# Patient Record
Sex: Female | Born: 1995 | Race: White | Hispanic: No | Marital: Single
Health system: Southern US, Community
[De-identification: ages and names within clinical notes are randomized; demographics above are authoritative.]

---

## 2020-01-14 ENCOUNTER — Other Ambulatory Visit: Payer: Self-pay

## 2020-01-14 ENCOUNTER — Emergency Department (HOSPITAL_COMMUNITY)
Admission: EM | Admit: 2020-01-14 | Discharge: 2020-01-14 | Disposition: A | Discharge status: Home / Self Care | Payer: BC Managed Care – PPO | Attending: Emergency Medicine | Admitting: Emergency Medicine

## 2020-01-14 ENCOUNTER — Encounter (HOSPITAL_COMMUNITY): Payer: Self-pay | Admitting: Emergency Medicine

## 2020-01-14 ENCOUNTER — Emergency Department (HOSPITAL_COMMUNITY): Payer: BC Managed Care – PPO

## 2020-01-14 DIAGNOSIS — S93402A Sprain of unspecified ligament of left ankle, initial encounter: Secondary | ICD-10-CM | POA: Insufficient documentation

## 2020-01-14 DIAGNOSIS — X500XXA Overexertion from strenuous movement or load, initial encounter: Secondary | ICD-10-CM | POA: Insufficient documentation

## 2020-01-14 DIAGNOSIS — Y929 Unspecified place or not applicable: Secondary | ICD-10-CM | POA: Diagnosis not present

## 2020-01-14 DIAGNOSIS — Y939 Activity, unspecified: Secondary | ICD-10-CM | POA: Diagnosis not present

## 2020-01-14 DIAGNOSIS — S99912A Unspecified injury of left ankle, initial encounter: Secondary | ICD-10-CM | POA: Diagnosis present

## 2020-01-14 DIAGNOSIS — Y999 Unspecified external cause status: Secondary | ICD-10-CM | POA: Insufficient documentation

## 2020-01-14 DIAGNOSIS — W19XXXA Unspecified fall, initial encounter: Secondary | ICD-10-CM

## 2020-01-14 MED ORDER — ACETAMINOPHEN 325 MG PO TABS
650.0000 mg | ORAL_TABLET | Freq: Once | ORAL | Status: AC
  Administered 2020-01-14: 650 mg via ORAL
  Filled 2020-01-14: qty 2

## 2020-01-14 NOTE — Discharge Instructions (Addendum)
You may alternate taking Tylenol and Ibuprofen as needed for pain control. You may take 400-600 mg of ibuprofen every 6 hours and 500-1000 mg of Tylenol every 6 hours. Do not exceed 4000 mg of Tylenol daily as this can lead to liver damage. Also, make sure to take Ibuprofen with meals as it can cause an upset stomach. Do not take other NSAIDs while taking Ibuprofen such as (Aleve, Naprosyn, Aspirin, Celebrex, etc) and do not take more than the prescribed dose as this can lead to ulcers and bleeding in your GI tract. You may use warm and cold compresses to help with your symptoms.   Please follow up with your primary doctor or with the orthopedic doctor within the next 7-10 days for re-evaluation and further treatment of your symptoms.   Please return to the ER sooner if you have any new or worsening symptoms.  

## 2020-01-14 NOTE — ED Triage Notes (Signed)
Patient was playing basketball and she heard her L ankle pop and twisted her ankle. Swollen and painful upon presentation.

## 2020-01-14 NOTE — ED Provider Notes (Signed)
[facility] DEPT Provider Note   CSN: [id number] Arrival date & time: 01/14/20  1734     History Chief Complaint  Patient presents with  . left ankle pain    Vicki Hatfield is a 24 y.o. female.  HPI     History reviewed. No pertinent past medical history.  There are no problems to display for this patient.   History reviewed. No pertinent surgical history.   OB History   No obstetric history on file.     No family history on file.  Social History   Tobacco Use  . Smoking status: Not on file  Substance Use Topics  . Alcohol use: Not on file  . Drug use: Not on file    Home Medications Prior to Admission medications   Not on File    Allergies    Patient has no allergy information on record.  Review of Systems   Review of Systems  Musculoskeletal:       Left ankle pain  Skin: Negative for wound.  Neurological: Negative for weakness and numbness.    Physical Exam Updated Vital Signs BP 105/69 (BP Location: Right Arm)   Pulse 97   Temp 99.7 F (37.6 C) (Oral)   Resp 20   Ht 5' (1.524 m)   Wt 70.3 kg   SpO2 98%   BMI 30.27 kg/m   Physical Exam Vitals and nursing note reviewed.  Constitutional:      General: She is not in acute distress.    Appearance: She is well-developed.  HENT:     Head: Normocephalic and atraumatic.  Eyes:     Conjunctiva/sclera: Conjunctivae normal.  Cardiovascular:     Rate and Rhythm: Normal rate.  Pulmonary:     Effort: Pulmonary effort is normal.  Musculoskeletal:        General: Normal range of motion.     Cervical back: Neck supple.     Comments: TTP to the left lateral malleolus with swelling. No medial malleolar TTP. No TTP throughout the foot.   Skin:    General: Skin is warm and dry.  Neurological:     Mental Status: She is alert.     ED Results / Procedures / Treatments   Labs (all labs ordered are listed, but only abnormal results are displayed) Labs Reviewed - No  data to display  EKG None  Radiology DG Ankle Left Port  Result Date: 01/14/2020 CLINICAL DATA:  24 year old female with fall and trauma to the left ankle. EXAM: PORTABLE LEFT ANKLE - 2 VIEW COMPARISON:  None. FINDINGS: There is apparent focal discontinuity of the medial cortex of the base of the navicular, seen on the oblique view, artifactual. Correlation with point tenderness recommended. No definite acute fracture. There is no dislocation. The bones are well mineralized. No arthritic changes. Mild soft tissue swelling over the ankle. No radiopaque foreign object or soft tissue gas. IMPRESSION: No acute fracture or dislocation. Electronically Signed   By: Anner Crete M.D.   On: 01/14/2020 18:10    Procedures Procedures (including critical care time)  Medications Ordered in ED Medications  acetaminophen (TYLENOL) tablet 650 mg (650 mg Oral Given 01/14/20 1845)    ED Course  I have reviewed the triage vital signs and the nursing notes.  Pertinent labs & imaging results that were available during my care of the patient were reviewed by me and considered in my medical decision making (see chart for details).    MDM Rules/Calculators/A&P  Patient presenting with ankle pain after twisting ankle prior to arrival.  Vital signs stable and patient nontoxic-appearing.  X-ray of left ankle negative for acute fracture abnormality. Personally reviewed and there was no evidence of fracture.  A splint was applied and crutches given.  OrthO follow-up given and patient advised to follow-up with either PCP or orthopedics in 1 week for reevaluation.  Advised Tylenol, ibuprofen, and rice protocol for pain.  Advised to return to the ER for any new or worsening symptoms in the meantime.  All questions were answered and patient understands plan and reasons to return.  Final Clinical Impression(s) / ED Diagnoses Final diagnoses:  Fall  Sprain of left ankle, unspecified ligament,  initial encounter    Rx / DC Orders ED Discharge Orders    None       Karrie Meres, PA-C 01/14/20 1847    Derwood Kaplan, MD 01/15/20 1516

## 2020-01-14 NOTE — ED Notes (Signed)
Ortho called to place ankle ASO and crutches.

## 2020-09-08 IMAGING — DX DG ANKLE PORT 2V*L*
3 series · 3 of 3 positions shown · non-contrast
Comparison: None.

CLINICAL DATA: 23-year-old female with fall and trauma to the left
ankle.

EXAM:
PORTABLE LEFT ANKLE - 2 VIEW

[ankle ap]
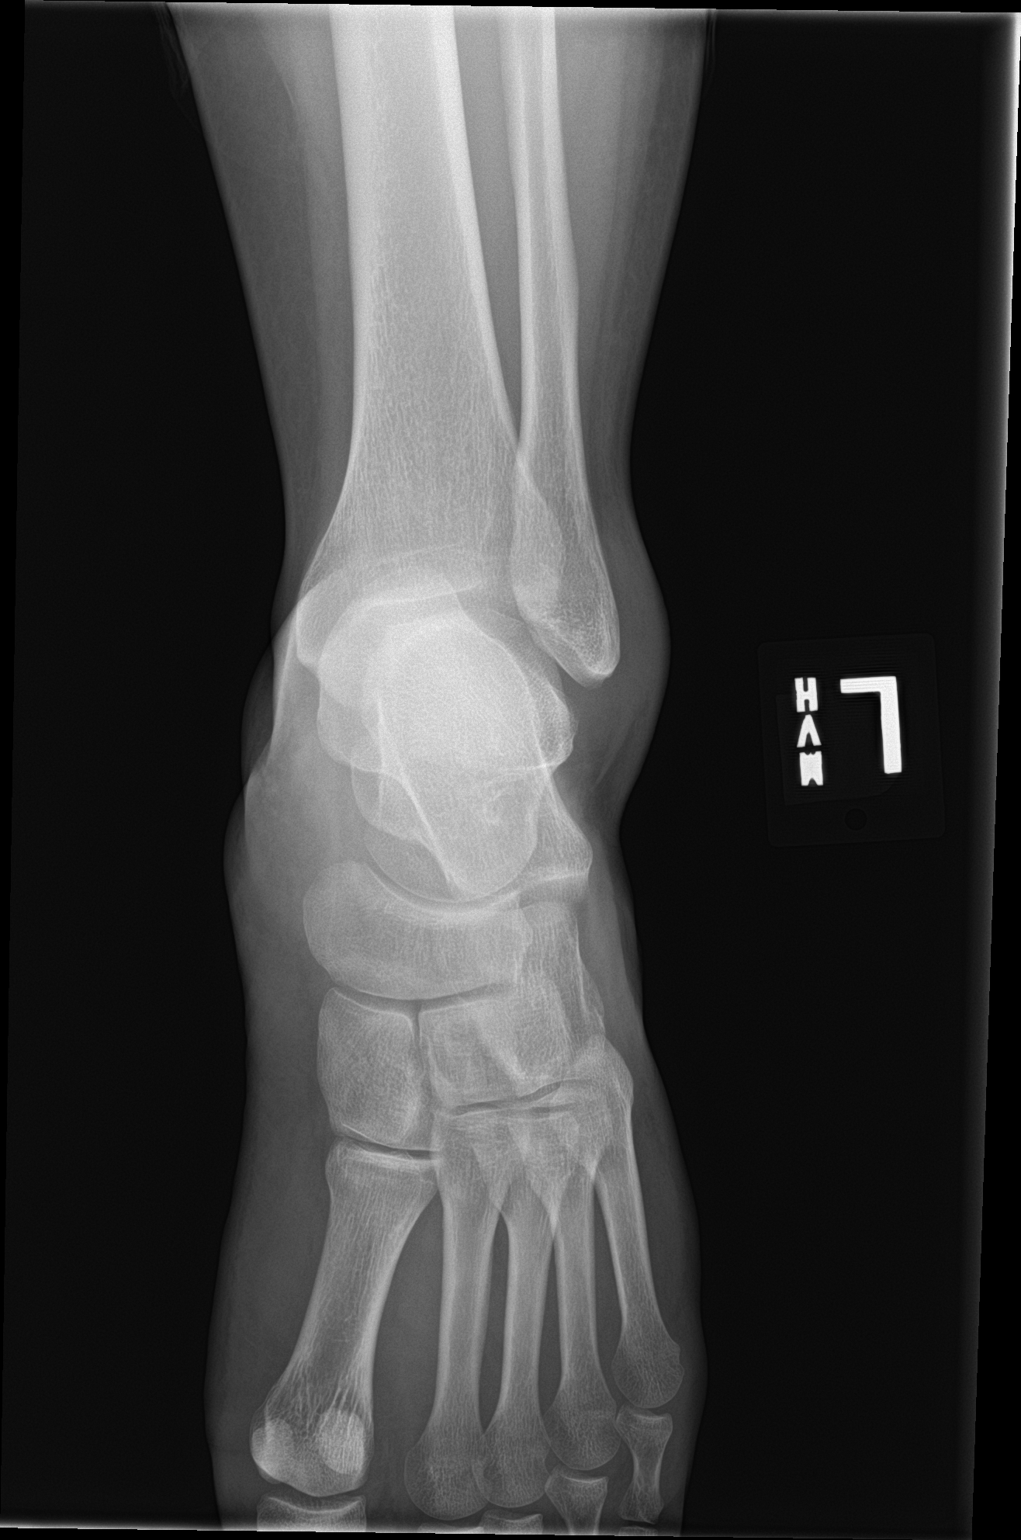

[ankle obl]
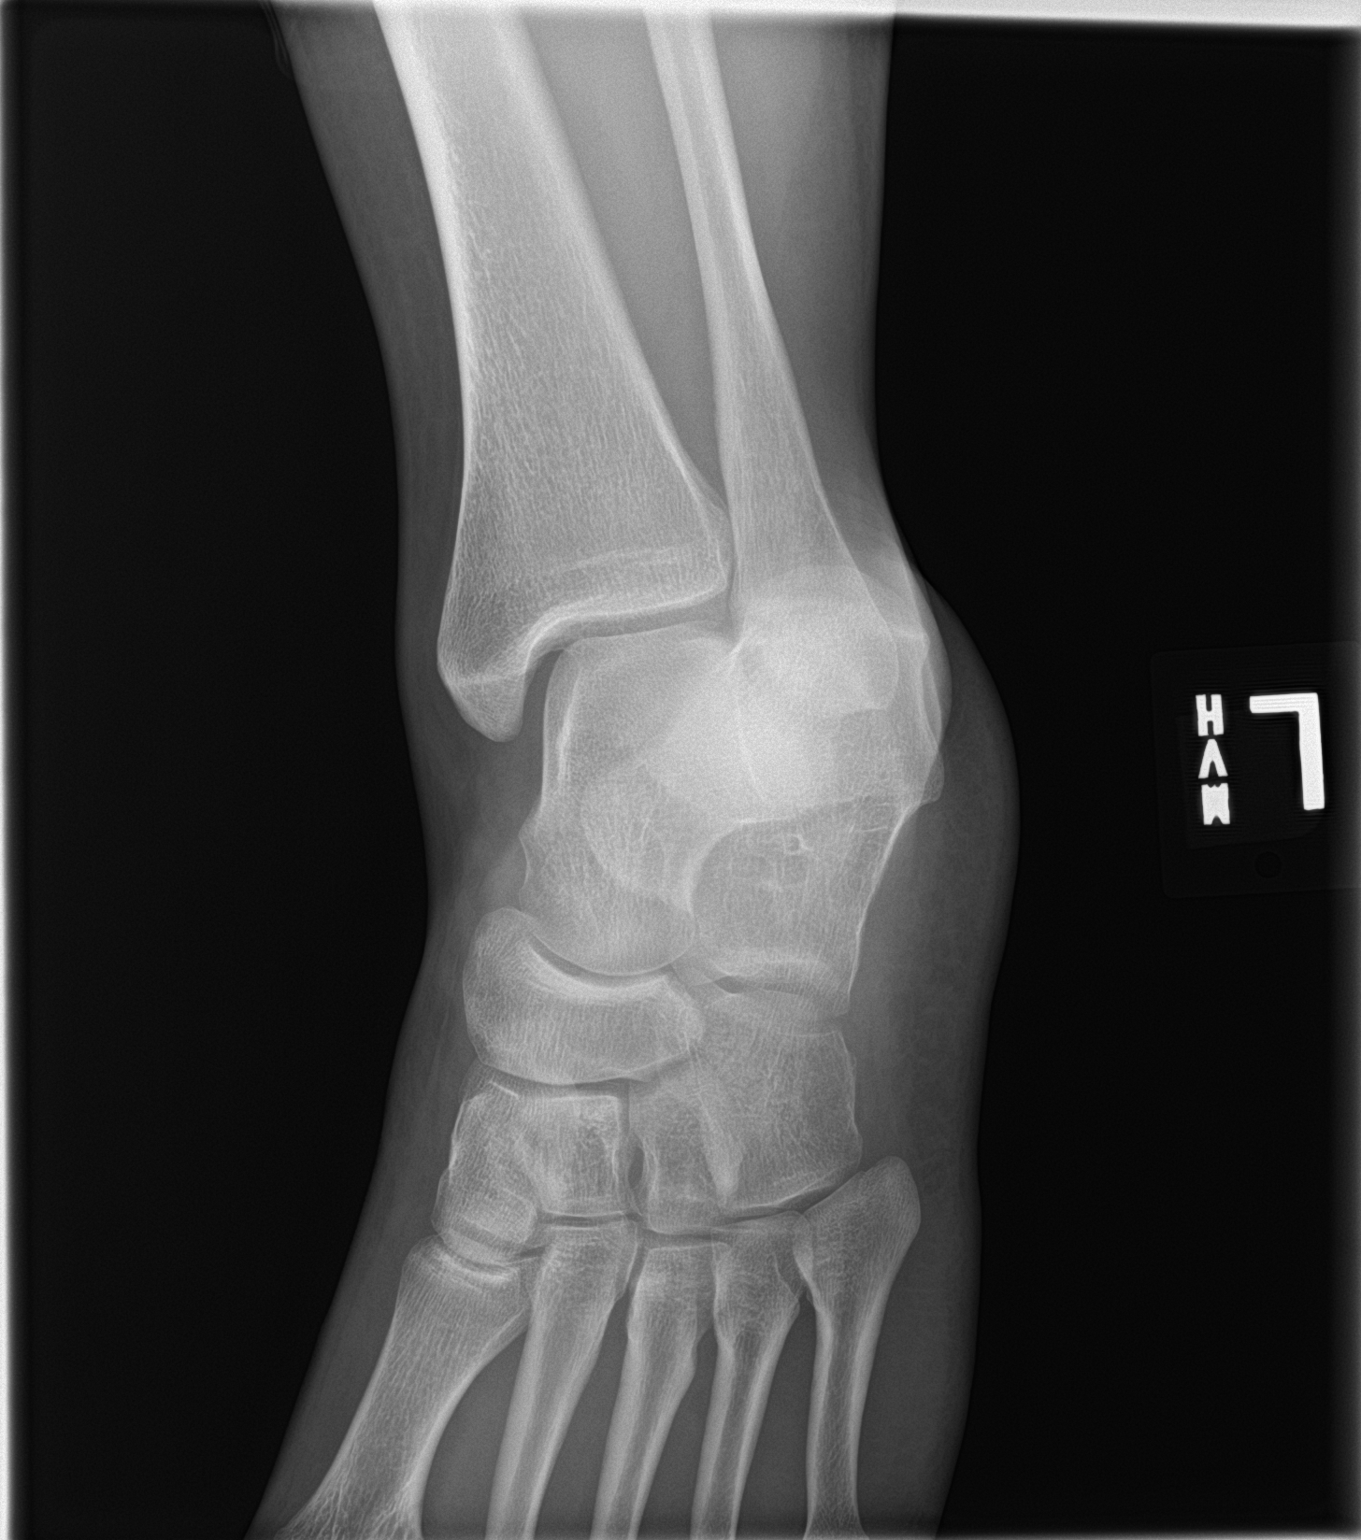

[ankle lat]
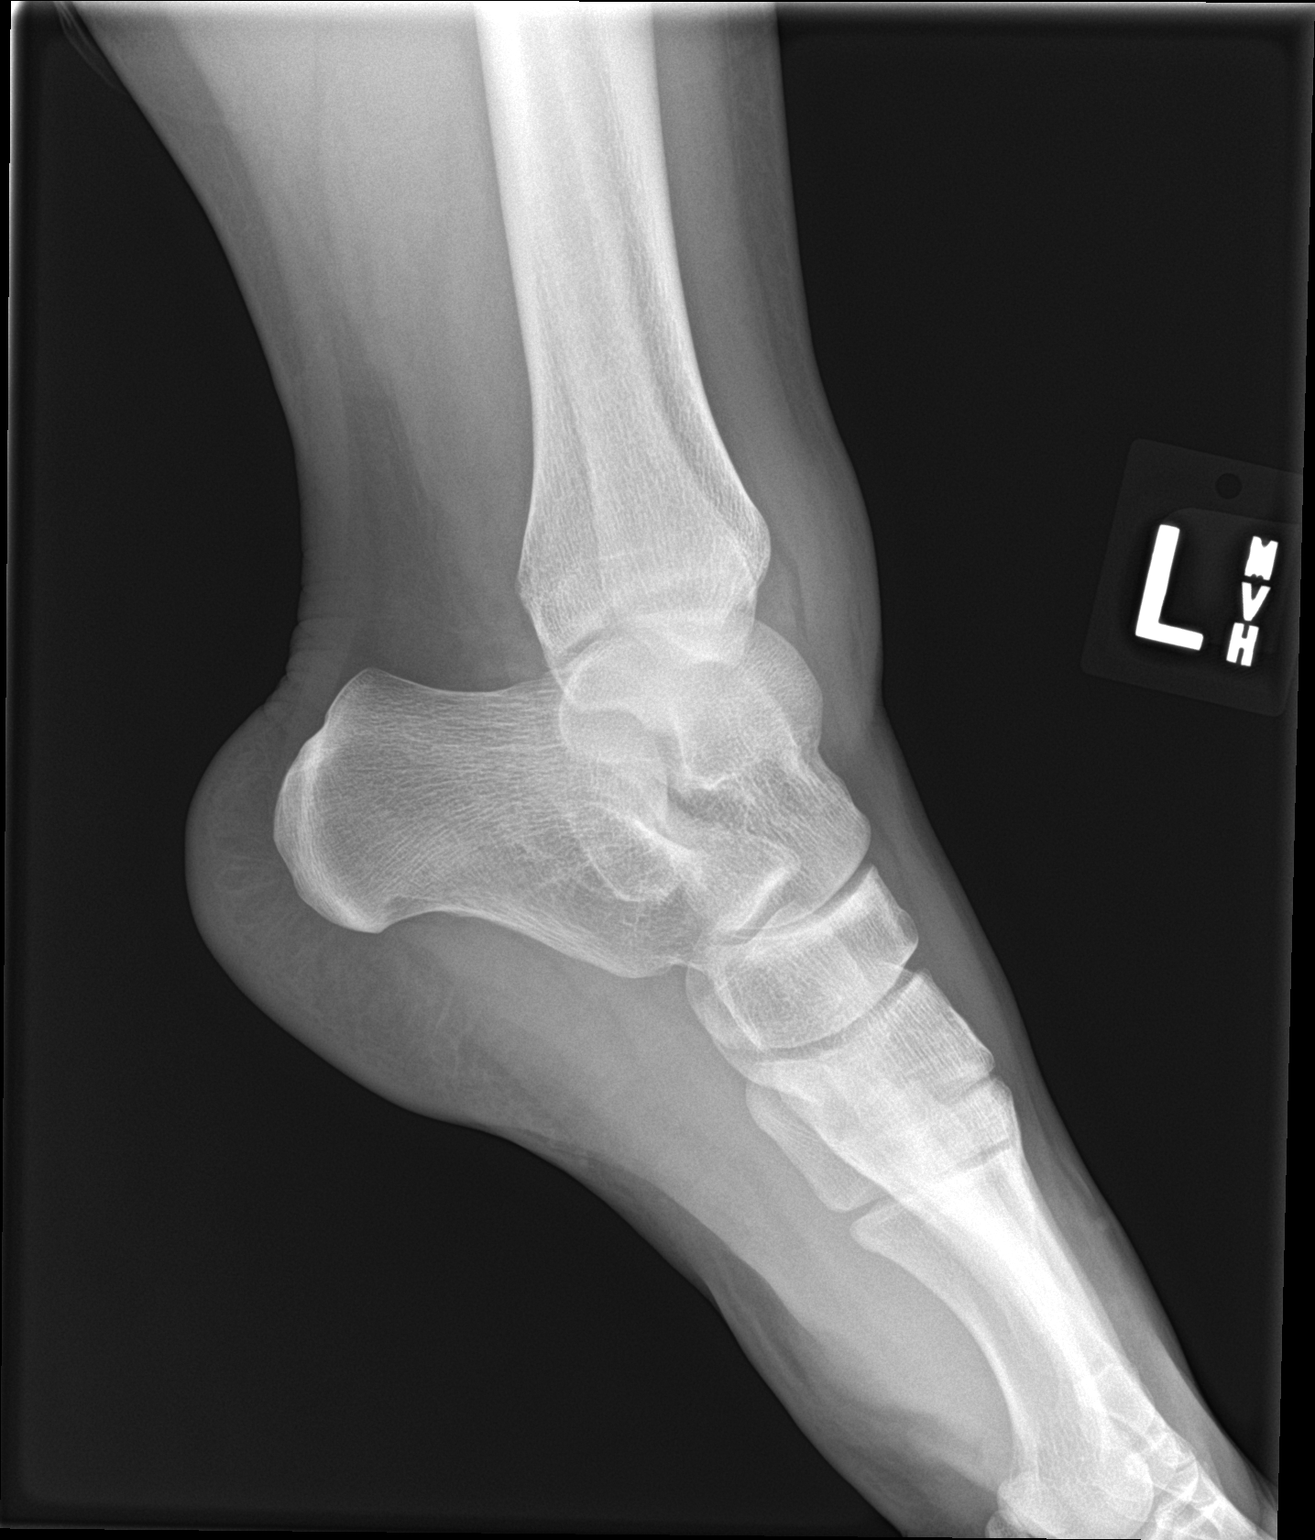

[3 of 3 positions shown; findings below may reference images not displayed]

FINDINGS: There is apparent focal discontinuity of the medial cortex of the
base of the navicular, seen on the oblique view, artifactual.
Correlation with point tenderness recommended. No definite acute
fracture. There is no dislocation. The bones are well mineralized.
No arthritic changes. Mild soft tissue swelling over the ankle. No
radiopaque foreign object or soft tissue gas.
IMPRESSION: No acute fracture or dislocation.
